# Patient Record
Sex: Male | Born: 1985 | Race: White | Hispanic: No | Marital: Single
Health system: Midwestern US, Community
[De-identification: ages and names within clinical notes are randomized; demographics above are authoritative.]

## PROBLEM LIST (undated history)

## (undated) DIAGNOSIS — F319 Bipolar disorder, unspecified: Secondary | ICD-10-CM

## (undated) DIAGNOSIS — F209 Schizophrenia, unspecified: Secondary | ICD-10-CM

## (undated) DIAGNOSIS — I10 Essential (primary) hypertension: Secondary | ICD-10-CM

---

## 2019-06-26 ENCOUNTER — Other Ambulatory Visit: Payer: Self-pay

## 2019-06-26 ENCOUNTER — Emergency Department (HOSPITAL_COMMUNITY)
Admission: EM | Admit: 2019-06-26 | Discharge: 2019-06-26 | Disposition: A | Discharge status: Home / Self Care | Payer: Self-pay | Attending: Emergency Medicine | Admitting: Emergency Medicine

## 2019-06-26 ENCOUNTER — Emergency Department (HOSPITAL_COMMUNITY): Payer: Self-pay

## 2019-06-26 ENCOUNTER — Emergency Department (HOSPITAL_BASED_OUTPATIENT_CLINIC_OR_DEPARTMENT_OTHER): Payer: Self-pay

## 2019-06-26 ENCOUNTER — Encounter (HOSPITAL_COMMUNITY): Payer: Self-pay | Admitting: Emergency Medicine

## 2019-06-26 DIAGNOSIS — R059 Cough, unspecified: Secondary | ICD-10-CM

## 2019-06-26 DIAGNOSIS — R05 Cough: Secondary | ICD-10-CM | POA: Insufficient documentation

## 2019-06-26 DIAGNOSIS — J069 Acute upper respiratory infection, unspecified: Secondary | ICD-10-CM | POA: Insufficient documentation

## 2019-06-26 DIAGNOSIS — R079 Chest pain, unspecified: Secondary | ICD-10-CM | POA: Insufficient documentation

## 2019-06-26 DIAGNOSIS — M25571 Pain in right ankle and joints of right foot: Secondary | ICD-10-CM | POA: Insufficient documentation

## 2019-06-26 DIAGNOSIS — R0602 Shortness of breath: Secondary | ICD-10-CM | POA: Insufficient documentation

## 2019-06-26 DIAGNOSIS — Z20828 Contact with and (suspected) exposure to other viral communicable diseases: Secondary | ICD-10-CM | POA: Insufficient documentation

## 2019-06-26 DIAGNOSIS — R072 Precordial pain: Secondary | ICD-10-CM | POA: Insufficient documentation

## 2019-06-26 DIAGNOSIS — I10 Essential (primary) hypertension: Secondary | ICD-10-CM | POA: Insufficient documentation

## 2019-06-26 DIAGNOSIS — M79609 Pain in unspecified limb: Secondary | ICD-10-CM

## 2019-06-26 DIAGNOSIS — F17210 Nicotine dependence, cigarettes, uncomplicated: Secondary | ICD-10-CM | POA: Insufficient documentation

## 2019-06-26 HISTORY — DX: Essential (primary) hypertension: I10

## 2019-06-26 LAB — CBC WITH DIFFERENTIAL/PLATELET
Abs Immature Granulocytes: 0.02 10*3/uL (ref 0.00–0.07)
Basophils Absolute: 0.1 10*3/uL (ref 0.0–0.1)
Basophils Relative: 1 %
Eosinophils Absolute: 0.4 10*3/uL (ref 0.0–0.5)
Eosinophils Relative: 5 %
HCT: 41.2 % (ref 39.0–52.0)
Hemoglobin: 13.8 g/dL (ref 13.0–17.0)
Immature Granulocytes: 0 %
Lymphocytes Relative: 36 %
Lymphs Abs: 2.8 10*3/uL (ref 0.7–4.0)
MCH: 31.6 pg (ref 26.0–34.0)
MCHC: 33.5 g/dL (ref 30.0–36.0)
MCV: 94.3 fL (ref 80.0–100.0)
Monocytes Absolute: 0.5 10*3/uL (ref 0.1–1.0)
Monocytes Relative: 7 %
Neutro Abs: 3.9 10*3/uL (ref 1.7–7.7)
Neutrophils Relative %: 51 %
Platelets: 197 10*3/uL (ref 150–400)
RBC: 4.37 MIL/uL (ref 4.22–5.81)
RDW: 12.9 % (ref 11.5–15.5)
WBC: 7.6 10*3/uL (ref 4.0–10.5)
nRBC: 0 % (ref 0.0–0.2)

## 2019-06-26 LAB — COMPREHENSIVE METABOLIC PANEL
ALT: 14 U/L (ref 0–44)
AST: 16 U/L (ref 15–41)
Albumin: 4.1 g/dL (ref 3.5–5.0)
Alkaline Phosphatase: 38 U/L (ref 38–126)
Anion gap: 8 (ref 5–15)
BUN: 15 mg/dL (ref 6–20)
CO2: 24 mmol/L (ref 22–32)
Calcium: 8.9 mg/dL (ref 8.9–10.3)
Chloride: 108 mmol/L (ref 98–111)
Creatinine, Ser: 0.78 mg/dL (ref 0.61–1.24)
GFR calc Af Amer: >60 mL/min (ref >60)
GFR calc non Af Amer: >60 mL/min (ref >60)
Glucose, Bld: 105 mg/dL — ABNORMAL HIGH (ref 70–99)
Potassium: 4.2 mmol/L (ref 3.5–5.1)
Sodium: 140 mmol/L (ref 135–145)
Total Bilirubin: 0.8 mg/dL (ref 0.3–1.2)
Total Protein: 6.6 g/dL (ref 6.5–8.1)

## 2019-06-26 LAB — TROPONIN I (HIGH SENSITIVITY)
Troponin I (High Sensitivity): <2 ng/L (ref <18)
Troponin I (High Sensitivity): <2 ng/L (ref <18)

## 2019-06-26 LAB — D-DIMER, QUANTITATIVE: D-Dimer, Quant: <0.27 ug/mL-FEU (ref 0.00–0.50)

## 2019-06-26 LAB — RESPIRATORY PANEL BY RT PCR (FLU A&B, COVID)
Influenza A by PCR: NEGATIVE
Influenza B by PCR: NEGATIVE
SARS Coronavirus 2 by RT PCR: NEGATIVE

## 2019-06-26 LAB — LIPASE, BLOOD: Lipase: 31 U/L (ref 11–51)

## 2019-06-26 LAB — GROUP A STREP BY PCR: Group A Strep by PCR: NOT DETECTED

## 2019-06-26 MED ORDER — CYCLOBENZAPRINE HCL 10 MG PO TABS: 10.0000 mg | ORAL_TABLET | Freq: Two times a day (BID) | ORAL | 0 refills | Status: AC | PRN

## 2019-06-26 MED ORDER — ONDANSETRON HCL 4 MG PO TABS: 4.0000 mg | ORAL_TABLET | Freq: Three times a day (TID) | ORAL | 0 refills | Status: AC | PRN

## 2019-06-26 NOTE — ED Triage Notes (Signed)
Pt c/o headache, cough, right foot pain, left chest pains, sore throat, sneezing for couple days. Wearing a brace on right foot.

## 2019-06-26 NOTE — ED Provider Notes (Signed)
[facility]-EMERGENCY DEPT Provider Note   CSN: [id number] Arrival date & time: 06/26/19  0801     History Chief Complaint  Patient presents with  . Headache  . Chest Pain  . Foot Pain    right  . Cough    William Mitchell is a 33 y.o. male.  The history is provided by the patient and medical records. No language interpreter was used.  Chest Pain Pain location:  L chest Pain quality: sharp   Pain radiates to:  Does not radiate Pain severity:  Moderate Onset quality:  Gradual Duration:  3 days Timing:  Intermittent Progression:  Waxing and waning Chronicity:  New Context: breathing   Relieved by:  Nothing Worsened by:  Coughing and deep breathing Ineffective treatments:  None tried Associated symptoms: cough, fatigue, headache, nausea and shortness of breath   Associated symptoms: no abdominal pain, no altered mental status, no back pain, no diaphoresis, no dizziness, no fever (chills), no lower extremity edema, no numbness, no palpitations, no vomiting and no weakness   Risk factors: hypertension, male sex and smoking   Risk factors: no prior DVT/PE        Past Medical History:  Diagnosis Date  . Hypertension     There are no problems to display for this patient.   History reviewed. No pertinent surgical history.     No family history on file.  Social History   Tobacco Use  . Smoking status: Current Every Day Smoker    Types: Cigarettes  . Smokeless tobacco: Never Used  Substance Use Topics  . Alcohol use: Not on file  . Drug use: Not on file    Home Medications Prior to Admission medications   Not on File    Allergies    Patient has no known allergies.  Review of Systems   Review of Systems  Constitutional: Positive for chills and fatigue. Negative for diaphoresis and fever (chills).  HENT: Positive for sore throat. Negative for congestion.   Eyes: Negative for photophobia and visual disturbance.  Respiratory: Positive  for cough and shortness of breath. Negative for chest tightness and wheezing.   Cardiovascular: Positive for chest pain. Negative for palpitations and leg swelling.  Gastrointestinal: Positive for nausea. Negative for abdominal pain, constipation, diarrhea and vomiting.  Genitourinary: Negative for flank pain and frequency.  Musculoskeletal: Negative for back pain, neck pain and neck stiffness.  Skin: Negative for rash and wound.  Neurological: Positive for headaches. Negative for dizziness, seizures, speech difficulty, weakness, light-headedness and numbness.  Psychiatric/Behavioral: Negative for agitation and confusion.  All other systems reviewed and are negative.   Physical Exam Updated Vital Signs BP (!) 125/59 (BP Location: Left Arm)   Pulse 74   Temp 97.8 F (36.6 C) (Oral)   Resp 15   SpO2 99%   Physical Exam Vitals and nursing note reviewed.  Constitutional:      General: He is not in acute distress.    Appearance: He is well-developed. He is not ill-appearing, toxic-appearing or diaphoretic.  HENT:     Head: Normocephalic and atraumatic.     Nose: Nose normal. No congestion or rhinorrhea.     Mouth/Throat:     Mouth: Mucous membranes are moist.     Pharynx: No oropharyngeal exudate or posterior oropharyngeal erythema.  Eyes:     Extraocular Movements: Extraocular movements intact.     Conjunctiva/sclera: Conjunctivae normal.     Pupils: Pupils are equal, round, and reactive to light.  Cardiovascular:     Rate and Rhythm: Normal rate and regular rhythm.     Pulses: Normal pulses.     Heart sounds: No murmur.  Pulmonary:     Effort: Pulmonary effort is normal. No respiratory distress.     Breath sounds: Normal breath sounds.  Abdominal:     General: Abdomen is flat. There is no distension.     Palpations: Abdomen is soft.     Tenderness: There is no abdominal tenderness. There is no right CVA tenderness or left CVA tenderness.  Musculoskeletal:        General:  Tenderness (R lower leg) present.     Cervical back: Neck supple. No tenderness.     Right lower leg: Tenderness present. No edema.     Left lower leg: No edema.       Legs:     Comments: Normal, pulse, sensation, and strength in the feet.  Tenderness in the Achilles area/lower leg/ankle on the right.  No significant edema appreciated.  Skin:    General: Skin is warm and dry.     Capillary Refill: Capillary refill takes less than 2 seconds.     Findings: No erythema or rash.  Neurological:     General: No focal deficit present.     Mental Status: He is alert and oriented to person, place, and time.  Psychiatric:        Mood and Affect: Mood normal.     ED Results / Procedures / Treatments   Labs (all labs ordered are listed, but only abnormal results are displayed) Labs Reviewed  COMPREHENSIVE METABOLIC PANEL - Abnormal; Notable for the following components:      Result Value   Glucose, Bld 105 (*)    All other components within normal limits  GROUP A STREP BY PCR  RESPIRATORY PANEL BY RT PCR (FLU A&B, COVID)  D-DIMER, QUANTITATIVE (NOT AT [facility])  CBC WITH DIFFERENTIAL/PLATELET  LIPASE, BLOOD  TROPONIN I (HIGH SENSITIVITY)  TROPONIN I (HIGH SENSITIVITY)    EKG EKG Interpretation  Date/Time:  Wednesday June 26 2019 08:11:00 EST Ventricular Rate:  76 PR Interval:    QRS Duration: 85 QT Interval:  376 QTC Calculation: 423 R Axis:   86 Text Interpretation: Sinus rhythm no acute ST/T changes No old tracing to compare Confirmed by Pricilla LovelessGoldston, Scott [phone]([id number]) on 06/26/2019 8:14:28 AM   Radiology DG Ankle Complete Right  Result Date: 06/26/2019 CLINICAL DATA:  Remote right ankle injury. New right ankle pain. No recent injury reported. EXAM: RIGHT ANKLE - COMPLETE 3+ VIEW COMPARISON:  None. FINDINGS: There is focal subchondral lucency in medial talus. Otherwise no fracture or subluxation. No aggressive appearing focal osseous lesions. No radiopaque foreign body. IMPRESSION:  Focal subchondral lucency in the medial talus, suggestive of an osteochondral lesion. MRI of the right ankle is indicated for further characterization. Electronically Signed   By: Delbert PhenixJason A Poff M.D.   On: 06/26/2019 09:49   DG Chest Portable 1 View  Result Date: 06/26/2019 CLINICAL DATA:  Left chest pain, cough, headache for several days EXAM: PORTABLE CHEST 1 VIEW COMPARISON:  None. FINDINGS: Normal heart size. Normal mediastinal contour. No pneumothorax. No pleural effusion. Lungs appear clear, with no acute consolidative airspace disease and no pulmonary edema. IMPRESSION: No active disease. Electronically Signed   By: Delbert PhenixJason A Poff M.D.   On: 06/26/2019 09:50   VAS US LOWER EXTREMITY VENOUS (DVT) (ONLY [facility] & [facility])  Result Date: 06/26/2019  Lower Venous Study Indications: Pain.  Comparison Study: No prior study. Performing Technologist: Gertie Fey MHA, RDMS, RVT, RDCS  Examination Guidelines: A complete evaluation includes B-mode imaging, spectral Doppler, color Doppler, and power Doppler as needed of all accessible portions of each vessel. Bilateral testing is considered an integral part of a complete examination. Limited examinations for reoccurring indications may be performed as noted.  +---------+---------------+---------+-----------+----------+--------------+ RIGHT    CompressibilityPhasicitySpontaneityPropertiesThrombus Aging +---------+---------------+---------+-----------+----------+--------------+ CFV      Full           Yes      Yes                                 +---------+---------------+---------+-----------+----------+--------------+ SFJ      Full                                                        +---------+---------------+---------+-----------+----------+--------------+ FV Prox  Full                                                        +---------+---------------+---------+-----------+----------+--------------+ FV Mid   Full                                                         +---------+---------------+---------+-----------+----------+--------------+ FV DistalFull                                                        +---------+---------------+---------+-----------+----------+--------------+ PFV      Full                                                        +---------+---------------+---------+-----------+----------+--------------+ POP      Full           Yes      Yes                                 +---------+---------------+---------+-----------+----------+--------------+ PTV      Full                                                        +---------+---------------+---------+-----------+----------+--------------+ PERO     Full                                                        +---------+---------------+---------+-----------+----------+--------------+  Summary: Right: There is no evidence of deep vein thrombosis in the lower extremity. No cystic structure found in the popliteal fossa. Ultrasound characteristics of enlarged lymph nodes are noted in the groin.  *See table(s) above for measurements and observations. Electronically signed by Monica Martinez MD on 06/26/2019 at 12:46:41 PM.    Final     Procedures Procedures (including critical care time)  Medications Ordered in ED Medications - No data to display  ED Course  I have reviewed the triage vital signs and the nursing notes.  Pertinent labs & imaging results that were available during my care of the patient were reviewed by me and considered in my medical decision making (see chart for details).    MDM Rules/Calculators/A&P                      Daniele Yankowski is a 33 y.o. male with a past medical history significant for hypertension, tobacco abuse, and prior right ankle injury years ago who presents with chills, productive cough, nausea, malaise, fatigue, pleuritic chest pain, and right leg pain.  Patient reports that for the last 3  or 4 days he has been having progressive symptoms with the chills and productive cough.  He reports a phlegm-like sputum.  He denies hemoptysis.  He reports his pain is in his left central chest and does not radiate.  It is very pleuritic but can be at rest.  He reports shortness of breath with it.  He reports nausea but no vomiting.  He denies any trauma.  He reports no radiation of the pain.  He reports no significant diaphoresis, constipation, or diarrhea.  No urinary symptoms.  He does report he has had right lower leg and ankle pain for the last few days causing him to get an ankle brace yesterday.  He says that he injured his right ankle several years ago but has not had any symptoms in the interim.  He denies any new trauma to the leg.  He denies any sick contacts to his knowledge.  He also reports some sore throat and mild headache.  On exam, lungs are clear and chest is nontender.  Abdomen is nontender.  Good pulses in all extremities.  Patient does have tenderness in his right Achilles and ankle area.  There is no significant swelling on my exam.  Good pulses, sensation, and strength in the feet.  Patient had no back tenderness or CVA tenderness.  Oropharynx was grossly unremarkable but he did feel he had scratchiness in his throat.  No focal neurologic deficits.  EKG shows no STEMI.  Heart score calculated as a 1.  Clinically I suspect patient has a viral infection causing his constellation of symptoms however, with the pleuritic chest pain, new leg pain, will get D-dimer and ultrasound leg.  Will also get x-ray given his prior injury to look for a change in his fracture.  Will get labs and chest x-ray for his chest discomfort and shortness of breath.  Low suspicion for meningitis given his lack of neck pain or neck stiffness with forage motion of neck.  No focal neurologic deficits and he is afebrile here.  Will get strep swab for the throat.  Given the ongoing coronavirus pandemic, will get the  rapid Covid test.  If this is negative, will likely do send out PCR test.  Anticipate reassessment after work-up.  If work-up is reassuring, dissipate discharge with nausea medication to help maintain hydration for likely viral URI  causing his symptoms.  1:32 PM Work-up returned overall reassuring. Troponin negative x2. Low risk for major adverse cardiac event given his heart score of 1. Covid test was negative as was flu test. D-dimer is negative. Ultrasound of the leg was negative. X-ray of the ankle showed no fracture dislocation but did show a subchondral abnormality talus. Patient notes he has a talus abnormality, we had a shared decision made conversation and agreed to hold on MRI today and have him follow-up as an outpatient for likely MRI imaging of this ankle. Chest x-ray shows no pneumonia and patient was feeling better.  Clinically I suspect the patient has a viral URI that is not influenza or Covid. Patient will be given prescription for nausea medication as well as a prescription for muscle relaxant as I think his chest pain is likely related to muscle pains. He will follow-up with his PCP and understood return precautions. He will use the nausea medicine to stay hydrated. He had no other questions or concerns and was discharged in good condition.   Final Clinical Impression(s) / ED Diagnoses Final diagnoses:  Precordial pain  Cough  Shortness of breath  Upper respiratory tract infection, unspecified type  Right ankle pain, unspecified chronicity    Rx / DC Orders ED Discharge Orders         Ordered    ondansetron (ZOFRAN) 4 MG tablet  Every 8 hours PRN     06/26/19 1336    cyclobenzaprine (FLEXERIL) 10 MG tablet  2 times daily PRN     06/26/19 1336          Clinical Impression: 1. Precordial pain   2. Cough   3. Shortness of breath   4. Upper respiratory tract infection, unspecified type   5. Right ankle pain, unspecified chronicity     Disposition:  Discharge  Condition: Good  I have discussed the results, Dx and Tx plan with the pt(& family if present). He/she/they expressed understanding and agree(s) with the plan. Discharge instructions discussed at great length. Strict return precautions discussed and pt &/or family have verbalized understanding of the instructions. No further questions at time of discharge.    New Prescriptions   CYCLOBENZAPRINE (FLEXERIL) 10 MG TABLET    Take 1 tablet (10 mg total) by mouth 2 (two) times daily as needed for muscle spasms.   ONDANSETRON (ZOFRAN) 4 MG TABLET    Take 1 tablet (4 mg total) by mouth every 8 (eight) hours as needed for nausea or vomiting.    Follow Up: [facility] [address] [place] [address] [phone] Schedule an appointment as soon as possible for a visit    [facility]-EMERGENCY DEPT [address] [id number] [address] [place] [address] [phone]       Nathon Stefanski, Canary Brim, MD 06/26/19 1736

## 2019-06-26 NOTE — Discharge Instructions (Signed)
Your work-up today was overall reassuring and we did not find evidence of Covid infection or influenza. There is no evidence of pneumonia. Your blood clot test was negative and your cardiac enzymes were negative both times. We did find the abnormality in your talus and your ankle which we recommend following up with a primary doctor or orthopedist for likely outpatient MRI. Your ultrasound of your leg was negative for blood clot as well. Given your reassuring work-up overall, feel you are safe for discharge home. Please use the nausea medicine to help maintain hydration. Please use the muscle relaxant to help with the discomfort in your chest. Please stay isolated and rest. If any symptoms change or worsen, please return to nearest emergency department.

## 2019-06-26 NOTE — Progress Notes (Signed)
Right lower extremity venous duplex completed. Refer to "CV Proc" under chart review to view preliminary results.  06/26/2019 10:23 AM Kelby Aline., MHA, RVT, RDCS, RDMS

## 2019-07-24 ENCOUNTER — Other Ambulatory Visit: Payer: Self-pay

## 2019-07-24 ENCOUNTER — Encounter (HOSPITAL_COMMUNITY): Payer: Self-pay

## 2019-07-24 ENCOUNTER — Emergency Department (HOSPITAL_COMMUNITY)
Admission: EM | Admit: 2019-07-24 | Discharge: 2019-07-24 | Disposition: A | Discharge status: Home / Self Care | Payer: Self-pay | Attending: Emergency Medicine | Admitting: Emergency Medicine

## 2019-07-24 DIAGNOSIS — Z760 Encounter for issue of repeat prescription: Secondary | ICD-10-CM | POA: Insufficient documentation

## 2019-07-24 DIAGNOSIS — F17210 Nicotine dependence, cigarettes, uncomplicated: Secondary | ICD-10-CM | POA: Insufficient documentation

## 2019-07-24 DIAGNOSIS — I10 Essential (primary) hypertension: Secondary | ICD-10-CM | POA: Insufficient documentation

## 2019-07-24 HISTORY — DX: Bipolar disorder, unspecified: F31.9

## 2019-07-24 HISTORY — DX: Schizophrenia, unspecified: F20.9

## 2019-07-24 NOTE — Discharge Instructions (Signed)
Please call and follow up closely with [facility] to get your medication refill as appropriate.  Use resources below for additional help as needed.

## 2019-07-24 NOTE — ED Triage Notes (Signed)
Patient states he ran out of his psych meds. patient states he gets his medications from [facility] and that he tried to call for an appointment and they would not call him back. Patient states he also has anxiety.

## 2019-07-24 NOTE — ED Provider Notes (Signed)
[facility] DEPT Provider Note   CSN: [id number] Arrival date & time: 07/24/19  1259     History Chief Complaint  Patient presents with  . Medication Refill    William Mitchell is a 34 y.o. male.  The history is provided by the patient. No language interpreter was used.  Medication Refill    34 year old male with history of schizophrenia, bipolar, hypertension, presenting to ED requesting for medication refill. Pt report he has been without his psychiatric medications for approximately 5 days.  sts that he gets his medication filled through [facility].  He called to have a f/u appointment for medication refill approximately 3 weeks ago but haven't heard back.  He decided to come to the ER to request for medication refill.  Patient states he has been feeling a bit sluggish since he is without his medication.  He does not have any SI or HI.  Denies any significant pain.  He mention one of his medications Depakote but unable to recount his other medications.  He does not have any other complaint.  Past Medical History:  Diagnosis Date  . Bipolar 1 disorder ([place])   . Hypertension   . Schizophrenia ([place])     There are no problems to display for this patient.   History reviewed. No pertinent surgical history.     Family History  Problem Relation Age of Onset  . Cancer Mother     Social History   Tobacco Use  . Smoking status: Current Every Day Smoker    Packs/day: 0.35    Types: Cigarettes  . Smokeless tobacco: Never Used  Substance Use Topics  . Alcohol use: Never  . Drug use: Never    Home Medications Prior to Admission medications   Medication Sig Start Date End Date Taking? Authorizing Provider  cyclobenzaprine (FLEXERIL) 10 MG tablet Take 1 tablet (10 mg total) by mouth 2 (two) times daily as needed for muscle spasms. 06/26/19   Tegeler, Gwenyth Allegra, MD  ondansetron (ZOFRAN) 4 MG tablet Take 1 tablet (4 mg total) by mouth every 8  (eight) hours as needed for nausea or vomiting. 06/26/19   Tegeler, Gwenyth Allegra, MD    Allergies    Patient has no known allergies.  Review of Systems   Review of Systems  All other systems reviewed and are negative.   Physical Exam Updated Vital Signs BP (!) 113/59 (BP Location: Left Arm)   Pulse 72   Temp 98.4 F (36.9 C) (Oral)   Resp 16   Ht 5\' 7"  (1.702 m)   Wt 73.5 kg   SpO2 99%   BMI 25.37 kg/m   Physical Exam Vitals and nursing note reviewed.  Constitutional:      General: He is not in acute distress.    Appearance: He is well-developed.  HENT:     Head: Atraumatic.  Eyes:     Conjunctiva/sclera: Conjunctivae normal.  Musculoskeletal:     Cervical back: Neck supple.  Skin:    Findings: No rash.  Neurological:     Mental Status: He is alert and oriented to person, place, and time.  Psychiatric:        Mood and Affect: Mood normal.        Speech: Speech normal.        Behavior: Behavior is cooperative.        Thought Content: Thought content does not include homicidal or suicidal ideation.     ED Results / Procedures / Treatments  Labs (all labs ordered are listed, but only abnormal results are displayed) Labs Reviewed - No data to display  EKG None  Radiology No results found.  Procedures Procedures (including critical care time)  Medications Ordered in ED Medications - No data to display  ED Course  I have reviewed the triage vital signs and the nursing notes.  Pertinent labs & imaging results that were available during my care of the patient were reviewed by me and considered in my medical decision making (see chart for details).    MDM Rules/Calculators/A&P                      BP (!) 113/59 (BP Location: Left Arm)   Pulse 72   Temp 98.4 F (36.9 C) (Oral)   Resp 16   Ht 5\' 7"  (1.702 m)   Wt 73.5 kg   SpO2 99%   BMI 25.37 kg/m   Final Clinical Impression(s) / ED Diagnoses Final diagnoses:  Medication refill    Rx /  DC Orders ED Discharge Orders    None     2:44 PM Patient presents requesting for medication refill for psychiatric medication.  He has reportedly been out of this medication for about 4 days.  There are no record of his previous history or any specific psychiatric medication on our system.  He denies SI or HI.  At this time he is currently stable.  Encourage patient to call and follow-up closely with [facility] to have his medication refilled and reassess as appropriate.  He is stable for discharge.   , PA-C 07/24/19 1445    07/26/19, MD 07/24/19 [phone]

## 2019-07-24 NOTE — ED Notes (Signed)
Pt verbalizes understanding of DC instructions. Pt belongings returned and is ambulatory out of ED.  

## 2020-07-23 ENCOUNTER — Inpatient Hospital Stay
Admit: 2020-07-23 | Discharge: 2020-07-23 | Disposition: A | Discharge status: Home / Self Care | Attending: Emergency Medicine

## 2020-07-23 DIAGNOSIS — S01511A Laceration without foreign body of lip, initial encounter: Secondary | ICD-10-CM

## 2020-07-23 LAB — COVID-19, RAPID: SARS-CoV-2, Rapid: NOT DETECTED

## 2020-07-23 LAB — COVID-19 RAPID TEST: COVID-19 rapid test: NOT DETECTED

## 2020-07-23 MED ORDER — KETOROLAC TROMETHAMINE 10 MG TAB
10 mg | ORAL | Status: AC
Start: 2020-07-23 — End: 2020-07-23
  Administered 2020-07-23: 11:00:00 via ORAL

## 2020-07-23 MED ORDER — AMOXICILLIN 500 MG TABLET
500 mg | ORAL_TABLET | Freq: Three times a day (TID) | ORAL | 0 refills | Status: AC
Start: 2020-07-23 — End: 2020-07-30

## 2020-07-23 MED ORDER — AMOXICILLIN 500 MG CAP
500 mg | ORAL | Status: AC
Start: 2020-07-23 — End: 2020-07-23
  Administered 2020-07-23: 11:00:00 via ORAL

## 2020-07-23 MED ORDER — NAPROXEN 500 MG TAB
500 mg | ORAL_TABLET | Freq: Two times a day (BID) | ORAL | 0 refills | Status: AC
Start: 2020-07-23 — End: 2020-08-02

## 2020-07-23 MED FILL — KETOROLAC TROMETHAMINE 10 MG TAB: 10 mg | ORAL | Qty: 1

## 2020-07-23 MED FILL — AMOXICILLIN 500 MG CAP: 500 mg | ORAL | Qty: 1

## 2020-07-23 NOTE — ED Notes (Signed)
I have reviewed discharge instructions with the patient.  The patient verbalized understanding.    Patient left ED via Discharge Method: ambulatory to Home.    Opportunity for questions and clarification provided.       Patient given 2 scripts.         To continue your aftercare when you leave the hospital, you may receive an automated call from our care team to check in on how you are doing.  This is a free service and part of our promise to provide the best care and service to meet your aftercare needs." If you have questions, or wish to unsubscribe from this service please call [phone].  Thank you for Choosing our [facility].
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
## 2020-12-28 DIAGNOSIS — R21 Rash and other nonspecific skin eruption: Secondary | ICD-10-CM

## 2020-12-28 NOTE — ED Provider Notes (Signed)
[facility] Emergency Department Provider Note                   PCP:                No primary care provider on file.               Age: 35 y.o.      Sex: male       ICD-10-CM    1. Rash and other nonspecific skin eruption  R21        DISPOSITION Decision To Discharge 12/28/2020 10:37:47 PM       Discharge Medication List as of 12/28/2020 10:44 PM      START taking these medications    Details   predniSONE (DELTASONE) 20 MG tablet Take 1 tablet by mouth 2 times daily for 5 days, Disp-10 tablet, R-0Print             No orders of the defined types were placed in this encounter.       Neva Ramaswamy, PA-C 11:45 PM      MDM  Number of Diagnoses or Management Options  Rash and other nonspecific skin eruption  Diagnosis management comments: Patient is a 35 year old male who presents to facility today with complaint of bumps on his right arm with some redness and itching.  On exam patient is well-appearing in no acute distress.  Stable vital signs.  Looks like potential allergic type dermatitis.  Cannot completely rule out reaction to drug use given markings on the patient's arm.  Patient given oral Decadron in the department prior to discharge.  Will discharge home with some steroids on the premise of an allergic type reaction.  Gave the patient contact information for follow-up at [facility].  Discussed return protocols the emergency department which she understands.  Patient ambulatory upon discharge in stable condition.    Voice dictation software was used during the making of this note.  This software is not perfect and grammatical and other typographical errors may be present.  This note has been proofread, but may still contain errors.  Jazziel Fitzsimmons, PA-C; 12/28/2020 @11 :46 PM   ===================================================================         Amount and/or Complexity of Data Reviewed  Review and summarize past medical records: yes  Independent visualization of images, tracings, or specimens: yes    Risk of  Complications, Morbidity, and/or Mortality  Presenting problems: low  Diagnostic procedures: low  Management options: low    Patient Progress  Patient progress: stable       Seth Novak is a 35 y.o. male who presents to the Emergency Department with chief complaint of    Chief Complaint   Patient presents with   ??? Insect Bite   ??? Urticaria      Patient presents to facility today with concern of red itchy areas on his right arm with some bumps all the way up and down his right arm.  Patient afebrile nontoxic in appearance.  Potential allergic type reaction as cause of symptoms.  He denies any abscesses, fevers, drainage from any of the areas, tenderness, or any other symptoms.    The history is provided by the patient.         Review of Systems   Constitutional: Negative for chills and fever.   Respiratory: Negative for shortness of breath.    Cardiovascular: Negative for chest pain.   Gastrointestinal: Negative for abdominal pain, nausea and vomiting.   Genitourinary: Negative for  dysuria, frequency and urgency.   Musculoskeletal: Negative for gait problem.   Skin: Positive for rash.   Neurological: Negative for dizziness, syncope and headaches.   Psychiatric/Behavioral: Negative for agitation and behavioral problems.   All other systems reviewed and are negative.      No past medical history on file.     No past surgical history on file.     No family history on file.        Social Connections:    ??? Frequency of Communication with Friends and Family: Not on file   ??? Frequency of Social Gatherings with Friends and Family: Not on file   ??? Attends Religious Services: Not on file   ??? Active Member of Clubs or Organizations: Not on file   ??? Attends Banker Meetings: Not on file   ??? Marital Status: Not on file        No Known Allergies     Vitals signs and nursing note reviewed.   Patient Vitals for the past 4 hrs:   Temp Pulse Resp BP SpO2   12/28/20 2147 98.5 ??F (36.9 ??C) 97 18 136/88 96 %           Physical Exam  Vitals and nursing note reviewed.   Constitutional:       General: He is not in acute distress.     Appearance: Normal appearance. He is not ill-appearing.   HENT:      Head: Normocephalic and atraumatic.      Right Ear: External ear normal.      Left Ear: External ear normal.   Eyes:      Extraocular Movements: Extraocular movements intact.      Conjunctiva/sclera: Conjunctivae normal.   Cardiovascular:      Rate and Rhythm: Normal rate and regular rhythm.      Pulses: Normal pulses.      Heart sounds: Normal heart sounds.   Pulmonary:      Effort: Pulmonary effort is normal.      Breath sounds: Normal breath sounds.   Abdominal:      General: Abdomen is flat.      Palpations: Abdomen is soft.   Musculoskeletal:         General: No swelling or tenderness. Normal range of motion.      Cervical back: Normal range of motion.   Skin:     General: Skin is warm and dry.      Capillary Refill: Capillary refill takes less than 2 seconds.      Findings: Rash present.      Comments: Some erythema overlying right forearm consistent with allergic type reaction; patient's repeated picking at bumps on his arms consistent with possible drug use reaction   Neurological:      General: No focal deficit present.      Mental Status: He is alert and oriented to person, place, and time.   Psychiatric:         Mood and Affect: Mood normal.         Behavior: Behavior normal.          Procedures      Labs Reviewed - No data to display     No orders to display                          Voice dictation software was used during the making of this note.  This software is not  perfect and grammatical and other typographical errors may be present.  This note has not been completely proofread for errors.     [facility], PA-C  12/28/20 2350

## 2020-12-28 NOTE — ED Triage Notes (Signed)
Patient reports possible insect bite to left forearm that occurred today. Unsure of when. Also reports generalized hives to left arm and neck as well. No fever noted.

## 2020-12-28 NOTE — Discharge Instructions (Signed)
Your rash and bumps across your arm and some of the redness may be secondary to an allergic type reaction.  If you have taken a new medications it may be a reaction to that.  Encourage you to keep clean with soap and water and take the steroid as prescribed for the next 5 days.  I encourage you to contact the clinic I have given you information for to schedule an appointment with them.

## 2020-12-29 ENCOUNTER — Inpatient Hospital Stay
Admit: 2020-12-29 | Discharge: 2020-12-29 | Disposition: A | Discharge status: Home / Self Care | Attending: Emergency Medicine

## 2020-12-29 MED ORDER — DEXAMETHASONE SODIUM PHOSPHATE 10 MG/ML IJ SOLN
10 MG/ML | INTRAMUSCULAR | Status: AC
Start: 2020-12-29 — End: 2020-12-28
  Administered 2020-12-29: 03:00:00 10 mg via ORAL

## 2020-12-29 MED ORDER — PREDNISONE 20 MG PO TABS
20 MG | ORAL_TABLET | Freq: Two times a day (BID) | ORAL | 0 refills | Status: AC
Start: 2020-12-29 — End: 2021-01-02

## 2020-12-29 MED FILL — DEXAMETHASONE SODIUM PHOSPHATE 10 MG/ML IJ SOLN: 10 mg/mL | INTRAMUSCULAR | Qty: 1

## 2021-03-08 DIAGNOSIS — M25571 Pain in right ankle and joints of right foot: Secondary | ICD-10-CM

## 2021-03-08 NOTE — ED Triage Notes (Addendum)
[place] [facility] [place] [facility] c/o right lower leg pain. Reports that he thinks that he was stung by something. Pt also states that he needs his buttocks looked at but refuses to elaborate. Pt talking and answering himself in triage. Pt not sure why he is here.

## 2021-03-08 NOTE — ED Provider Notes (Signed)
[facility] Emergency Department Provider Note                   PCP:                None Provider               Age: 35 y.o.      Sex: male       ICD-10-CM    1. Acute right ankle pain  M25.571           DISPOSITION Decision To Discharge 03/09/2021 12:36:07 AM         Orders Placed This Encounter   Procedures    XR ANKLE RIGHT (MIN 3 VIEWS)        Seth Novak is a 35 y.o. male who presents to the Emergency Department with chief complaint of    Chief Complaint   Patient presents with    Leg Pain      35 year old male, pleasant albeit sleepy presents this department chief complaint of right ankle pain.  Of note, he had to be redirected 4 times from the waiting room to come to the triage.  He attempted first to go sit at the security desk, then the registration desk.  When finally in triage she states he has some right ankle pain and he is visibly sleepy.  He states last night he was doing some back flips and thinks he hurt it during this.  He was ambulatory to triage without visualize difficulty.  Pain is nonradiating and described as aching.  He has not attempted any over-the-counter modalities for pain relief.  He has no other medical complaints or concerns at this time.    The history is provided by the patient. No language interpreter was used.       Review of Systems   Constitutional:  Negative for chills and fever.   Musculoskeletal:  Positive for arthralgias.   All other systems reviewed and are negative.    No past medical history on file.     No past surgical history on file.     No family history on file.     Social History     Socioeconomic History    Marital status: Single         Patient has no known allergies.     Previous Medications    No medications on file        Vitals signs and nursing note reviewed.   Patient Vitals for the past 4 hrs:   Temp Pulse Resp BP SpO2   03/08/21 2204 98 ??F (36.7 ??C) 89 18 138/84 100 %          Physical Exam  Vitals and nursing note reviewed.   Constitutional:        General: He is not in acute distress.     Appearance: Normal appearance. He is normal weight. He is not ill-appearing, toxic-appearing or diaphoretic.   HENT:      Head: Normocephalic and atraumatic.      Nose: Nose normal.      Mouth/Throat:      Mouth: Mucous membranes are moist.   Eyes:      Pupils: Pupils are equal, round, and reactive to light.   Cardiovascular:      Rate and Rhythm: Normal rate.   Pulmonary:      Effort: Pulmonary effort is normal.   Abdominal:      General: Abdomen is flat.  Palpations: Abdomen is soft.   Psychiatric:         Attention and Perception: He is inattentive.         Behavior: Behavior is slowed.        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  While he is not psychotic today I do suspect patient is coming off of amphetamine binge.  He is very sleepy had recalled from the waiting room multiple times to wake him up.  Complaining of some right ankle pain after doing some somersaults last night.  Will obtain screening x-ray however low suspicion for acute fracture.       Amount and/or Complexity of Data Reviewed  Tests in the radiology section of CPT??: ordered and reviewed  Tests in the medicine section of CPT??: ordered    Risk of Complications, Morbidity, and/or Mortality  Presenting problems: low  Diagnostic procedures: low  Management options: low        Procedures      Labs Reviewed - No data to display     XR ANKLE RIGHT (MIN 3 VIEWS)   Final Result   1. Lateral ankle soft tissue swelling.   2. Talar dome osteochondritis dissecans.                              Voice dictation software was used during the making of this note.  This software is not perfect and grammatical and other typographical errors may be present.  This note has not been completely proofread for errors.     Alesia Richards, [place]  03/09/21 [phone]

## 2021-03-09 ENCOUNTER — Inpatient Hospital Stay: Admit: 2021-03-09 | Primary: Sports Medicine

## 2021-03-09 ENCOUNTER — Inpatient Hospital Stay: Primary: Sports Medicine

## 2021-03-09 ENCOUNTER — Inpatient Hospital Stay
Admit: 2021-03-09 | Discharge: 2021-03-09 | Disposition: A | Discharge status: Home / Self Care | Attending: General Practice

## 2021-03-09 ENCOUNTER — Inpatient Hospital Stay: Admit: 2021-03-09 | Discharge: 2021-03-09

## 2021-03-09 NOTE — ED Notes (Signed)
I have reviewed discharge instructions with the patient.  The patient verbalized understanding.    Patient left ED via Discharge Method: ambulatory to Home with self    Opportunity for questions and clarification provided.       Patient given 0 scripts.         To continue your aftercare when you leave the hospital, you may receive an automated call from our care team to check in on how you are doing.  This is a free service and part of our promise to provide the best care and service to meet your aftercare needs.??? If you have questions, or wish to unsubscribe from this service please call [phone].  Thank you for Choosing our [facility] Emergency Department.        Noni Saupe, RN  03/09/21 0201

## 2021-03-14 ENCOUNTER — Inpatient Hospital Stay: Admit: 2021-03-14 | Discharge: 2021-03-14 | Attending: General Practice

## 2021-03-14 NOTE — ED Provider Notes (Signed)
Patient was arrived in error.  I am accessing and completing this note solely to discard the record from Epic computer system.  It is required from the electronic medical record to assign a physician to these records and I was assigned this for this reason.  I had no clinical encounter with this patient and did not otherwise examine the case.  No physician billing should be completed.    Azion Centrella O. Manson Passey, MD 12:31 PM           Christia Reading, MD  03/16/21 [phone]

## 2021-07-06 IMAGING — DX DG ANKLE COMPLETE 3+V*R*
3 series · 3 of 3 positions shown · non-contrast
Comparison: None.

CLINICAL DATA: Remote right ankle injury. New right ankle pain. No
recent injury reported.

EXAM:
RIGHT ANKLE - COMPLETE 3+ VIEW

[ankle ap]
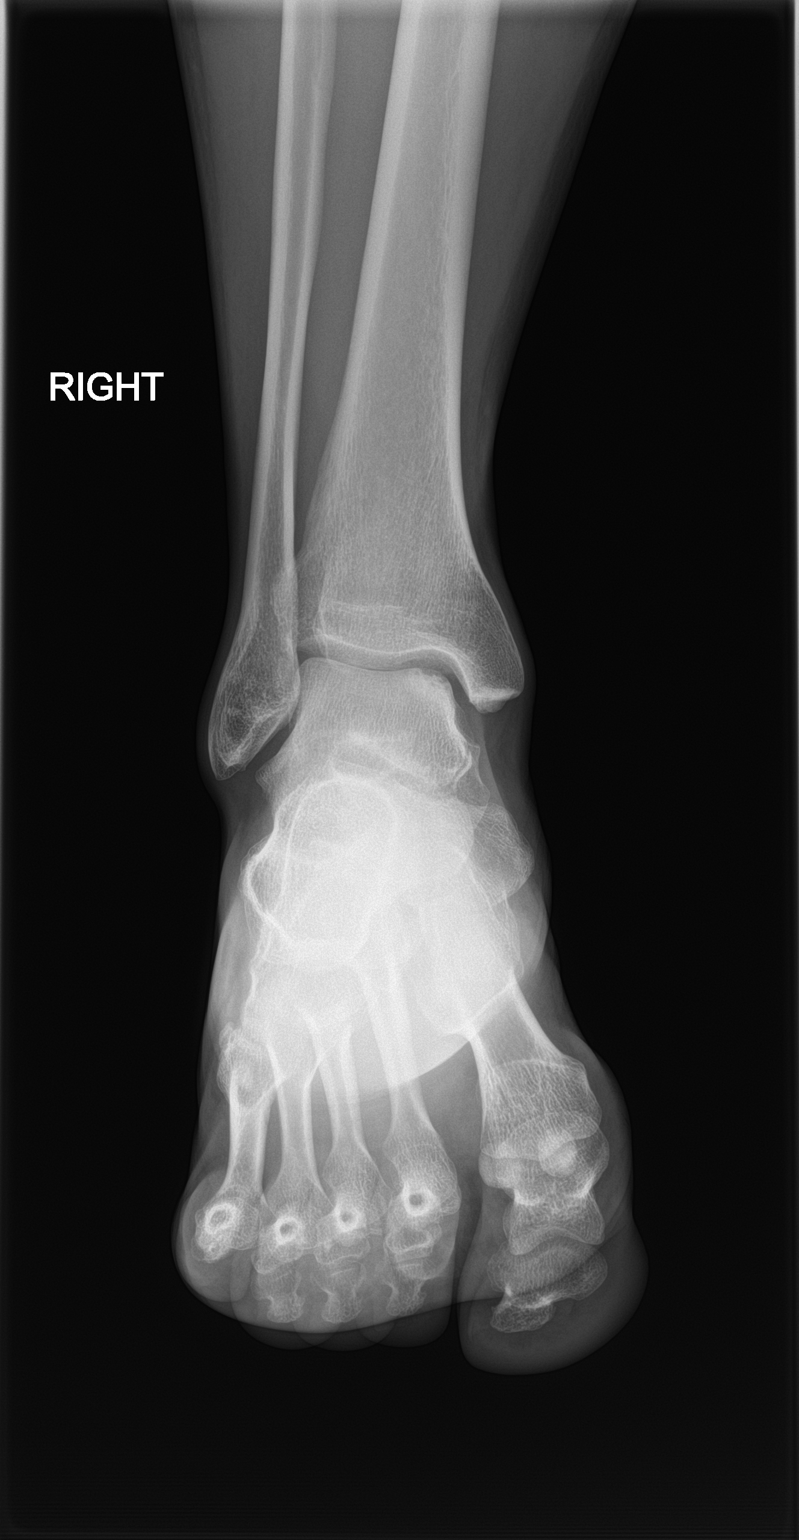

[ankle obl]
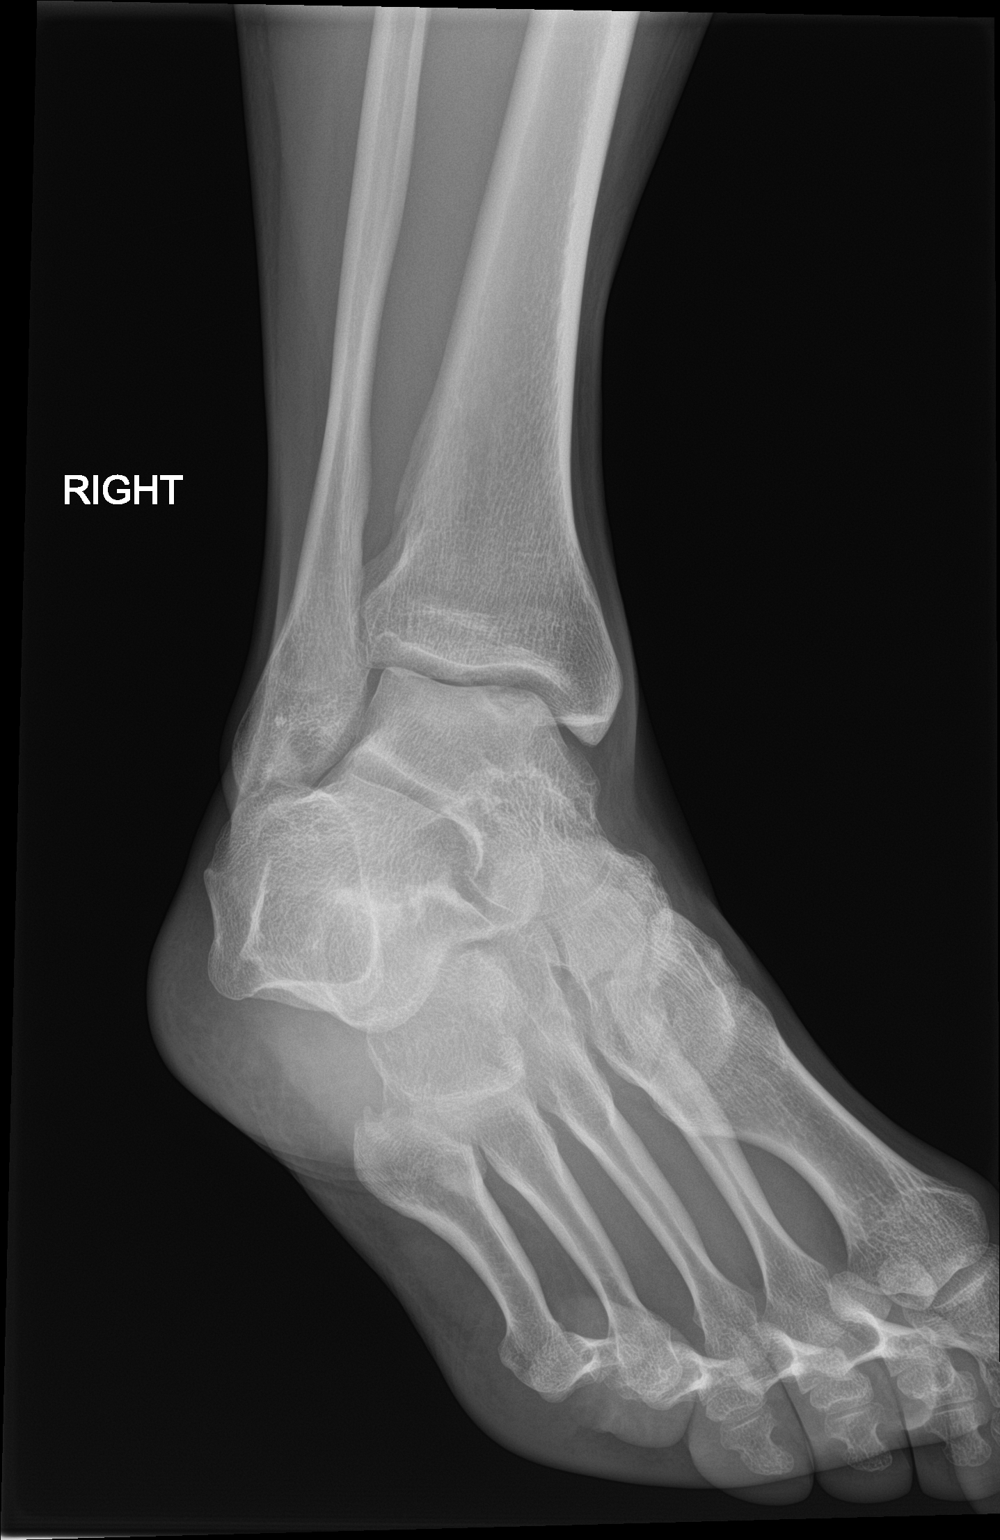

[ankle lat]
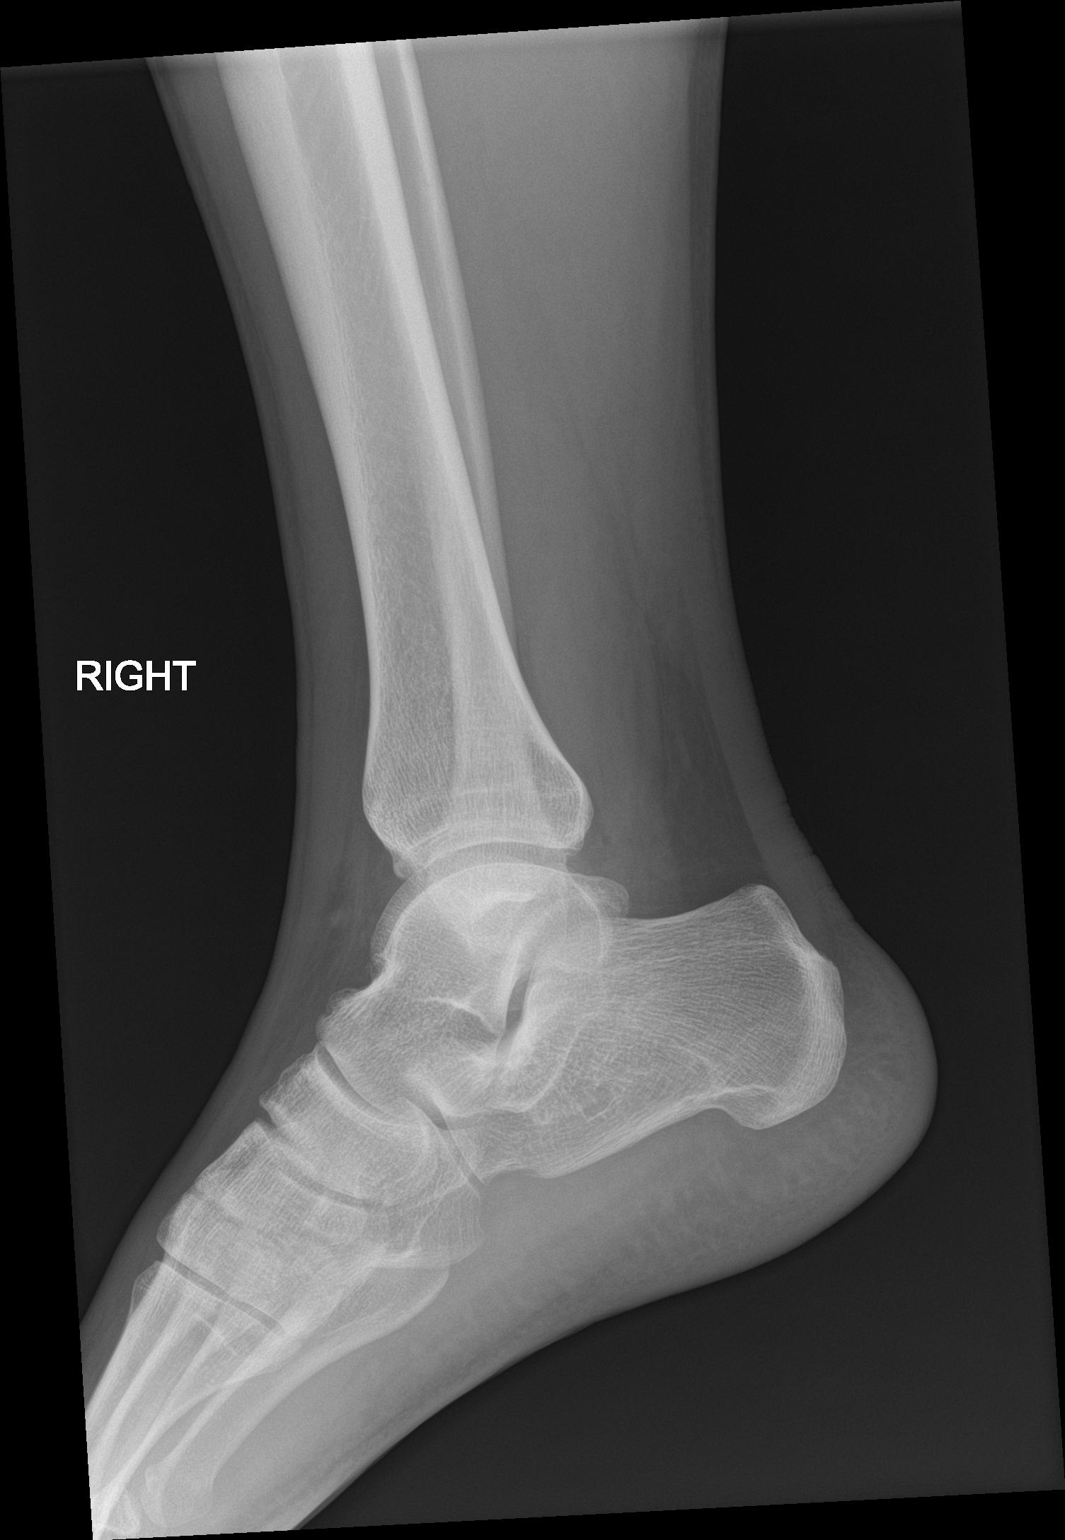

[3 of 3 positions shown; findings below may reference images not displayed]

FINDINGS: There is focal subchondral lucency in medial talus. Otherwise no
fracture or subluxation. No aggressive appearing focal osseous
lesions. No radiopaque foreign body.
IMPRESSION: Focal subchondral lucency in the medial talus, suggestive of an
osteochondral lesion. MRI of the right ankle is indicated for
further characterization.
